# Patient Record
Sex: Female | Born: 1950 | Race: White | Hispanic: No | Smoking: Current every day smoker
Health system: Southern US, Community
[De-identification: ages and names within clinical notes are randomized; demographics above are authoritative.]

---

## 2019-07-10 ENCOUNTER — Other Ambulatory Visit: Payer: Self-pay

## 2019-07-10 ENCOUNTER — Ambulatory Visit (HOSPITAL_COMMUNITY)
Admission: EM | Admit: 2019-07-10 | Discharge: 2019-07-10 | Disposition: A | Discharge status: Home / Self Care | Payer: Medicare Other

## 2019-07-10 ENCOUNTER — Encounter (HOSPITAL_COMMUNITY): Payer: Self-pay

## 2019-07-10 ENCOUNTER — Ambulatory Visit (INDEPENDENT_AMBULATORY_CARE_PROVIDER_SITE_OTHER): Payer: Medicare Other

## 2019-07-10 DIAGNOSIS — R058 Other specified cough: Secondary | ICD-10-CM

## 2019-07-10 DIAGNOSIS — R05 Cough: Secondary | ICD-10-CM

## 2019-07-10 DIAGNOSIS — F17200 Nicotine dependence, unspecified, uncomplicated: Secondary | ICD-10-CM

## 2019-07-10 DIAGNOSIS — E119 Type 2 diabetes mellitus without complications: Secondary | ICD-10-CM

## 2019-07-10 DIAGNOSIS — J4530 Mild persistent asthma, uncomplicated: Secondary | ICD-10-CM

## 2019-07-10 DIAGNOSIS — J019 Acute sinusitis, unspecified: Secondary | ICD-10-CM

## 2019-07-10 DIAGNOSIS — R0981 Nasal congestion: Secondary | ICD-10-CM

## 2019-07-10 MED ORDER — PROMETHAZINE-DM 6.25-15 MG/5ML PO SYRP: 5.0000 mL | ORAL_SOLUTION | Freq: Every evening | ORAL | 0 refills | Status: AC | PRN

## 2019-07-10 MED ORDER — AMOXICILLIN-POT CLAVULANATE 875-125 MG PO TABS: 1.0000 | ORAL_TABLET | Freq: Two times a day (BID) | ORAL | 0 refills | Status: AC

## 2019-07-10 MED ORDER — BENZONATATE 100 MG PO CAPS: 100.0000 mg | ORAL_CAPSULE | Freq: Three times a day (TID) | ORAL | 0 refills | Status: AC | PRN

## 2019-07-10 NOTE — ED Provider Notes (Signed)
[facility]   MRN: [id number] DOB: Aug 13, 1950  Subjective:   Megan Humphrey is a 69 y.o. female presenting for 10 day hx of productive cough, runny and stuffy nose, throat pain from post-nasal drainage, hoarseness from persistent coughing. Has had COVID vaccination, 3 COVID tests recently and all were negative, last test was December 2020. Smokes 1/2ppd. Has a hx of asthma. Was seen at an urgent care, took 40mg  prednisone x5 days, received steroid injection as well.  Does not want to be tested for COVID-19.  Patient is actually from and since traveling here has had a very difficult time with the change in environment.  No current facility-administered medications for this encounter.  Current Outpatient Medications:  .  albuterol (PROVENTIL) (2.5 MG/3ML) 0.083% nebulizer solution, SMARTSIG:3 Milliliter(s) Via Nebulizer Every 6 Hours PRN, Disp: , Rfl:  .  D3-50 1.25 MG (50000 UT) capsule, Take 50,000 Units by mouth once a week., Disp: , Rfl:  .  FLUoxetine (PROZAC) 20 MG capsule, Take 20 mg by mouth daily., Disp: , Rfl:  .  gabapentin (NEURONTIN) 300 MG capsule, Take 300 mg by mouth 2 (two) times daily., Disp: , Rfl:  .  metFORMIN (GLUCOPHAGE) 1000 MG tablet, Take 1,000 mg by mouth 2 (two) times daily., Disp: , Rfl:  .  simvastatin (ZOCOR) 40 MG tablet, Take 40 mg by mouth at bedtime., Disp: , Rfl:    Not on File  Past medical history of diabetes, asthma, dyslipidemia.  History reviewed. No pertinent surgical history.  History reviewed. No pertinent family history.  Social History   Tobacco Use  . Smoking status: Current Every Day Smoker  . Smokeless tobacco: Never Used  Substance Use Topics  . Alcohol use: Not on file  . Drug use: Not on file    ROS   Objective:   Vitals: BP (!) 155/77 (BP Location: Left Arm)   Pulse 89   Temp 98.4 F (36.9 C) (Oral)   Resp 18   SpO2 100%   Physical Exam Constitutional:      General: She is not in acute  distress.    Appearance: Normal appearance. She is well-developed. She is not ill-appearing, toxic-appearing or diaphoretic.  HENT:     Head: Normocephalic and atraumatic.     Right Ear: Tympanic membrane and external ear normal.     Left Ear: Tympanic membrane and external ear normal.     Nose: Congestion and rhinorrhea present.     Mouth/Throat:     Mouth: Mucous membranes are moist.     Pharynx: Posterior oropharyngeal erythema present. No oropharyngeal exudate.     Comments: Significant post-nasal drainage. Eyes:     General: No scleral icterus.       Right eye: No discharge.        Left eye: No discharge.     Extraocular Movements: Extraocular movements intact.     Pupils: Pupils are equal, round, and reactive to light.  Cardiovascular:     Rate and Rhythm: Normal rate and regular rhythm.     Pulses: Normal pulses.     Heart sounds: Normal heart sounds. No murmur heard.  No friction rub. No gallop.   Pulmonary:     Effort: Pulmonary effort is normal. No respiratory distress.     Breath sounds: Normal breath sounds. No stridor. No wheezing, rhonchi or rales.  Skin:    General: Skin is warm and dry.     Findings: No rash.  Neurological:  Mental Status: She is alert and oriented to person, place, and time.  Psychiatric:        Mood and Affect: Mood normal.        Behavior: Behavior normal.        Thought Content: Thought content normal.        Judgment: Judgment normal.     DG Chest 2 View  Result Date: 07/10/2019 CLINICAL DATA:  Productive cough for 11 days, history diabetes mellitus, smoking EXAM: CHEST - 2 VIEW COMPARISON:  None FINDINGS: Normal heart size, mediastinal contours, and pulmonary vascularity. Mild peribronchial thickening. No pulmonary infiltrate, pleural effusion, or pneumothorax. Mild osseous demineralization. Multilevel endplate spur formation thoracic spine. IMPRESSION: Mild bronchitic changes without infiltrate. Electronically Signed   By: Lavonia Dana  M.D.   On: 07/10/2019 12:59     Assessment and Plan :   PDMP not reviewed this encounter.  1. Productive cough   2. Mild persistent asthma without complication   3. Smoker   4. Type 2 diabetes mellitus without complication, without long-term current use of insulin ([place])   5. Sinus congestion   6. Acute non-recurrent sinusitis, unspecified location     Use Augmentin to cover for sinusitis, maintain chronic medications.  Counseled against using more steroids as she is a diabetic and just received an injection and finished a oral prednisone course.  Recommend patient report to the ER if symptoms persist she will need a chest CT scan to rule out PE or other cardiopulmonary process.  Counseled patient on potential for adverse effects with medications prescribed/recommended today, ER and return-to-clinic precautions discussed, patient verbalized understanding.    Jaynee Eagles, PA-C 07/10/19 1340

## 2019-07-10 NOTE — ED Triage Notes (Signed)
Pt presents today for cough, runny nose, sore throat, hoarse voice. Pt states she has strong productive cough, getting up tan secretions after albuterol treatments. Pt states that she was seen at urgent care for same 1 week ago, given steroid taper with no symptom improvement. Pt not requesting covid testing at this time. Pt denies chest pain, or shortness of breath. Pt denies fevers, n/v/d, body aches, loss of taste or smell.

## 2020-11-30 IMAGING — DX DG CHEST 2V
2 series · 2 of 2 positions shown · non-contrast
Comparison: None

CLINICAL DATA: Productive cough for 11 days, history diabetes
mellitus, smoking

EXAM:
CHEST - 2 VIEW

[chest pa]
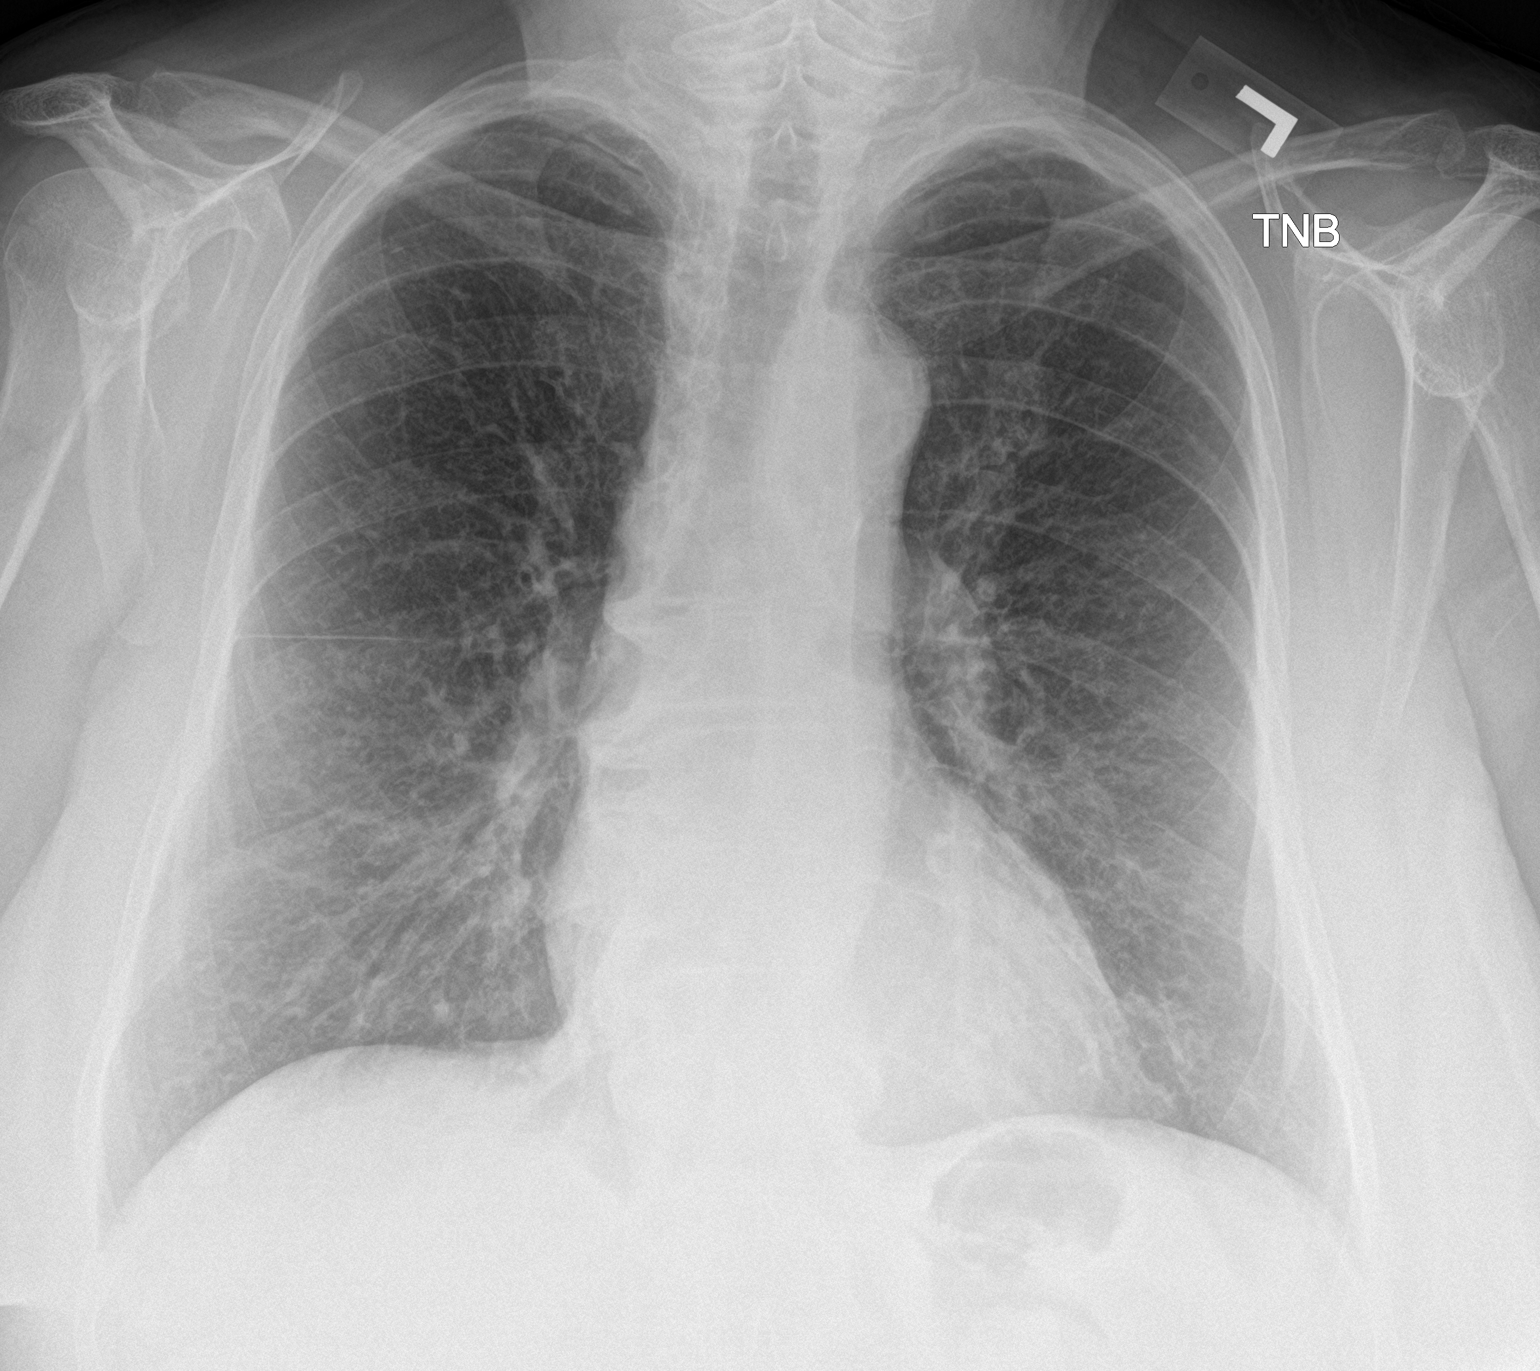

[chest lat]
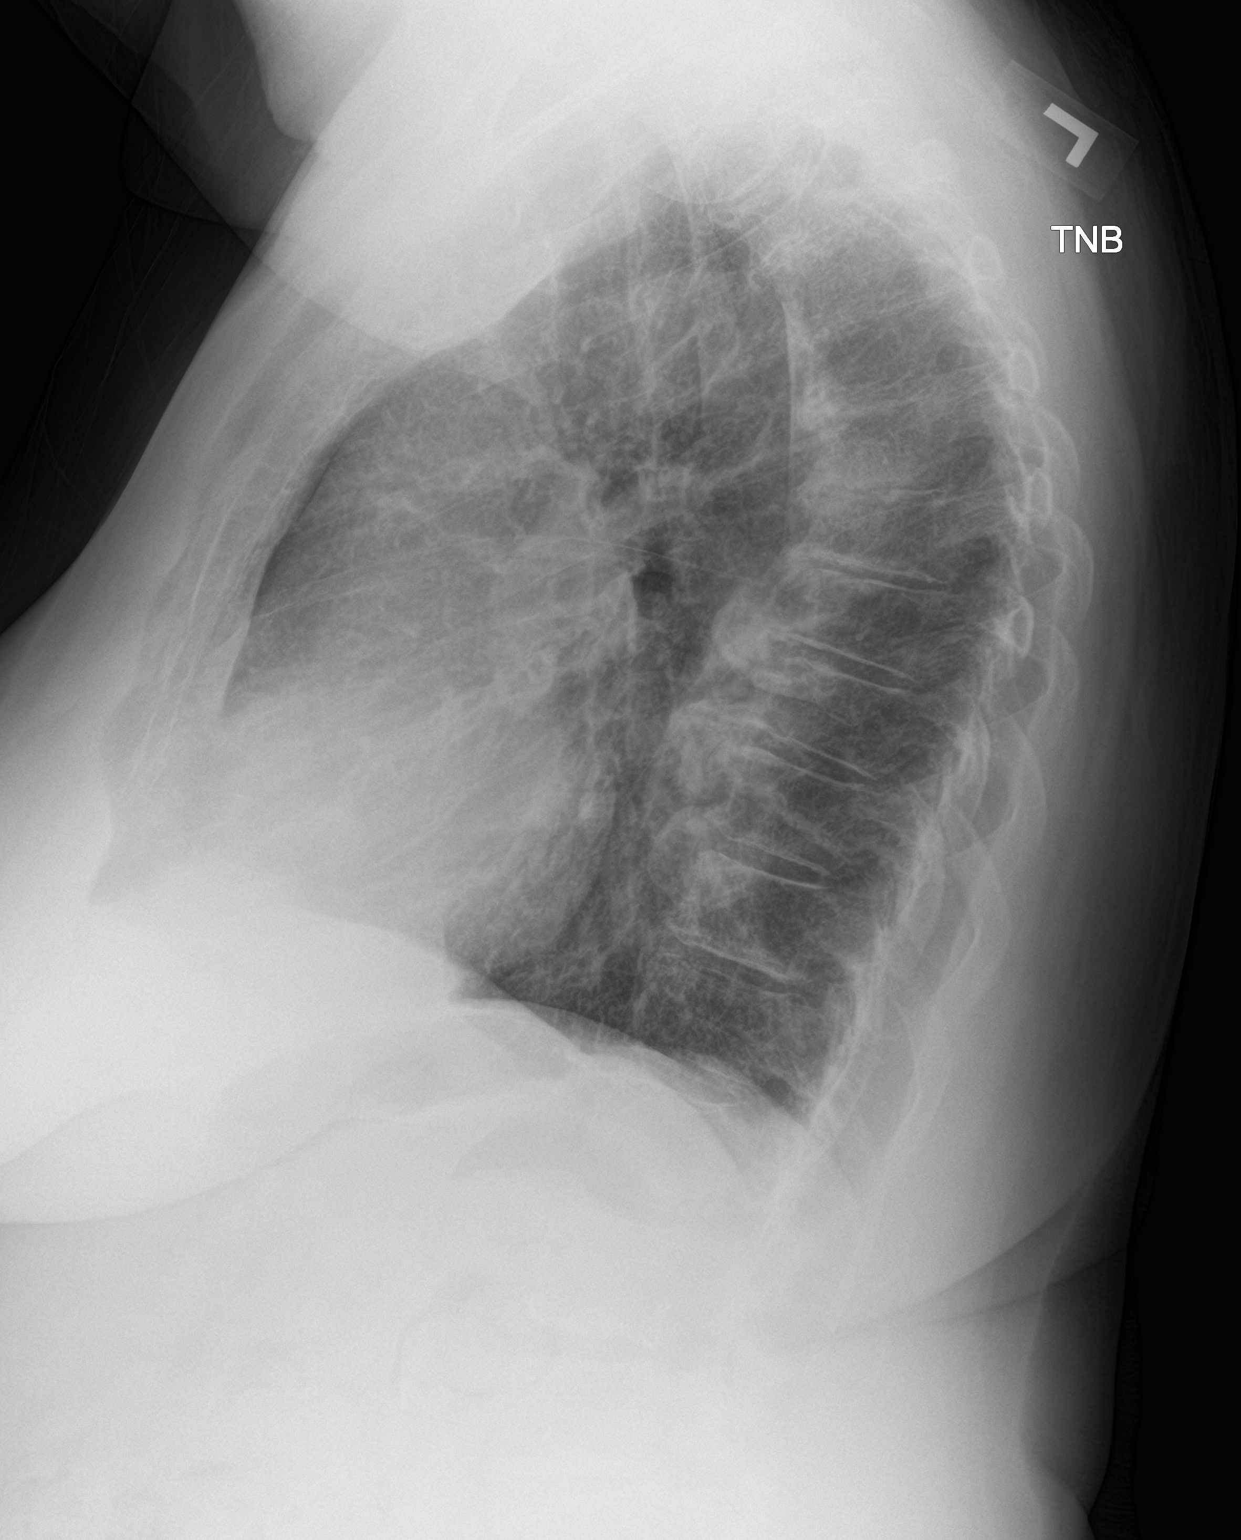

[2 of 2 positions shown; findings below may reference images not displayed]

FINDINGS: Normal heart size, mediastinal contours, and pulmonary vascularity.

Mild peribronchial thickening.

No pulmonary infiltrate, pleural effusion, or pneumothorax.

Mild osseous demineralization.

Multilevel endplate spur formation thoracic spine.
IMPRESSION: Mild bronchitic changes without infiltrate.
